# Patient Record
Sex: Male | Born: 1990 | Race: White | Hispanic: No | Marital: Married | Smoking: Former smoker
Health system: Southern US, Community
[De-identification: ages and names within clinical notes are randomized; demographics above are authoritative.]

---

## 2015-09-29 HISTORY — PX: LAPAROSCOPIC INGUINAL HERNIA WITH UMBILICAL HERNIA: SHX5658

## 2020-07-22 ENCOUNTER — Emergency Department (INDEPENDENT_AMBULATORY_CARE_PROVIDER_SITE_OTHER)
Admission: RE | Admit: 2020-07-22 | Discharge: 2020-07-22 | Disposition: A | Discharge status: Home / Self Care | Source: Ambulatory Visit

## 2020-07-22 ENCOUNTER — Other Ambulatory Visit: Payer: Self-pay

## 2020-07-22 ENCOUNTER — Emergency Department (INDEPENDENT_AMBULATORY_CARE_PROVIDER_SITE_OTHER)

## 2020-07-22 ENCOUNTER — Telehealth: Payer: Self-pay | Admitting: Emergency Medicine

## 2020-07-22 VITALS — BP 154/87 | HR 85 | Temp 98.4°F | Resp 17

## 2020-07-22 DIAGNOSIS — M25521 Pain in right elbow: Secondary | ICD-10-CM

## 2020-07-22 DIAGNOSIS — W19XXXA Unspecified fall, initial encounter: Secondary | ICD-10-CM

## 2020-07-22 NOTE — Telephone Encounter (Signed)
Call to see if pt was able to come in sooner. Pt works in [employer] until [employer], lives near [place] and wants to keep appointment for now. 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 It has been evaluated by the [employer] in the past.

## 2020-07-22 NOTE — ED Triage Notes (Signed)
Pt c/o RT elbow pain since last night. 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 Reinjured in 2018. Fell in shower last night reaggravating injury. Pain 6.5/10

## 2020-07-22 NOTE — ED Provider Notes (Signed)
[facility]    CSN: [id number] Arrival date & time: 07/22/20  1842      History   Chief Complaint Chief Complaint  Patient presents with  . Elbow Pain    RT    HPI Timothy Brady is a 29 y.o. male.   HPI Timothy Brady is a 29 y.o. male presenting to UC with c/o exacerbation of chronic Right elbow pain. He injured it about 10 years ago, it has hurt on and off since then.  Last night, he re-injured it after hitting his elbow on a hard surface. Pain is aching and sore, 6/10, worse directly over olecranon process. Worse with certain movement.    History reviewed. No pertinent past medical history.  There are no problems to display for this patient.   History reviewed. No pertinent surgical history.     Home Medications    Prior to Admission medications   Not on File    Family History History reviewed. No pertinent family history.  Social History Social History   Tobacco Use  . Smoking status: Never Smoker  . Smokeless tobacco: Former Neurosurgeon    Types: Engineer, drilling  . Vaping Use: Every day  . Devices: a few months  Substance Use Topics  . Alcohol use: Yes    Comment: occ  . Drug use: Not on file     Allergies   Patient has no known allergies.   Review of Systems Review of Systems  Musculoskeletal: Positive for arthralgias. Negative for joint swelling and myalgias.  Skin: Negative for color change and wound.     Physical Exam Triage Vital Signs ED Triage Vitals  Enc Vitals Group     BP 07/22/20 1935 (!) 154/87     Pulse Rate 07/22/20 1935 85     Resp 07/22/20 1935 17     Temp 07/22/20 1935 98.4 F (36.9 C)     Temp Source 07/22/20 1935 Oral     SpO2 07/22/20 1935 98 %     Weight --      Height --      Head Circumference --      Peak Flow --      Pain Score 07/22/20 1939 6     Pain Loc --      Pain Edu? --      Excl. in GC? --    No data found.  Updated Vital Signs BP (!) 154/87 (BP Location: Right Arm)   Pulse 85    Temp 98.4 F (36.9 C) (Oral)   Resp 17   SpO2 98%   Visual Acuity Right Eye Distance:   Left Eye Distance:   Bilateral Distance:    Right Eye Near:   Left Eye Near:    Bilateral Near:     Physical Exam Vitals and nursing note reviewed.  Constitutional:      Appearance: He is well-developed.  HENT:     Head: Normocephalic and atraumatic.  Cardiovascular:     Rate and Rhythm: Normal rate.  Pulmonary:     Effort: Pulmonary effort is normal.  Musculoskeletal:        General: Tenderness (mild tenderness over olecranon. no crepitus) present. No swelling. Normal range of motion.     Cervical back: Normal range of motion.  Skin:    General: Skin is warm and dry.     Findings: No bruising or erythema.  Neurological:     Mental Status: He is alert and oriented to person,  place, and time.  Psychiatric:        Behavior: Behavior normal.      UC Treatments / Results  Labs (all labs ordered are listed, but only abnormal results are displayed) Labs Reviewed - No data to display  EKG   Radiology No results found.  Procedures Procedures (including critical care time)  Medications Ordered in UC Medications - No data to display  Initial Impression / Assessment and Plan / UC Course  I have reviewed the triage vital signs and the nursing notes.  Pertinent labs & imaging results that were available during my care of the patient were reviewed by me and considered in my medical decision making (see chart for details).     Reviewed imaging with pt Encouraged use of elbow sleeve and sling for comfort F/u with Sports Medicine  AVS given  Final Clinical Impressions(s) / UC Diagnoses   Final diagnoses:  Right elbow pain     Discharge Instructions      You may take 500mg  acetaminophen every 4-6 hours or in combination with ibuprofen 400-600mg  every 6-8 hours as needed for pain and inflammation.  You can use the elbow sleeve and sling for comfort. Call to schedule an  appointment with Sports Medicine later this week for further evaluation and treatment of symptoms.     ED Prescriptions    None     PDMP not reviewed this encounter.   , Lurene Shadow 07/25/20 [phone]

## 2020-07-22 NOTE — Discharge Instructions (Signed)
  You may take 500mg  acetaminophen every 4-6 hours or in combination with ibuprofen 400-600mg  every 6-8 hours as needed for pain and inflammation.  You can use the elbow sleeve and sling for comfort. Call to schedule an appointment with Sports Medicine later this week for further evaluation and treatment of symptoms.

## 2020-07-31 ENCOUNTER — Ambulatory Visit: Payer: Self-pay

## 2020-07-31 ENCOUNTER — Other Ambulatory Visit: Payer: Self-pay | Admitting: Family Medicine

## 2020-07-31 ENCOUNTER — Other Ambulatory Visit: Payer: Self-pay

## 2020-07-31 ENCOUNTER — Ambulatory Visit (INDEPENDENT_AMBULATORY_CARE_PROVIDER_SITE_OTHER): Admitting: Family Medicine

## 2020-07-31 ENCOUNTER — Encounter: Payer: Self-pay | Admitting: Family Medicine

## 2020-07-31 VITALS — BP 137/88 | HR 84 | Ht 67.0 in | Wt 205.0 lb

## 2020-07-31 DIAGNOSIS — M778 Other enthesopathies, not elsewhere classified: Secondary | ICD-10-CM | POA: Diagnosis not present

## 2020-07-31 DIAGNOSIS — M25521 Pain in right elbow: Secondary | ICD-10-CM

## 2020-07-31 MED ORDER — NITROGLYCERIN 0.2 MG/HR TD PT24: MEDICATED_PATCH | TRANSDERMAL | 11 refills | Status: DC

## 2020-07-31 MED FILL — NITROGLYCERIN 0.2 MG/HR PTC: 0.2 | 80 days supply | Qty: 20 | Fill #0

## 2020-07-31 NOTE — Progress Notes (Signed)
Timothy Brady - 29 y.o. male MRN [id number]  Date of birth: [birth year]  SUBJECTIVE:  Including CC & ROS.  Chief Complaint  Patient presents with  . Elbow Pain    right    Timothy Brady is a 29 y.o. male that is presenting with acute on chronic right elbow pain.  He has fallen on this part of his elbow multiple times.  He feels it at the tip of the olecranon.  No history of surgery.  He has pain with or without activity.  Does not workout like he normally does.  Seems localized to the elbow.  Independent review of the right elbow x-ray from 10/25 shows mild dorsal swelling.   Review of Systems See HPI   HISTORY: Past Medical, Surgical, Social, and Family History Reviewed & Updated per EMR.   Pertinent Historical Findings include:  No past medical history on file.  No past surgical history on file.  No family history on file.  Social History   Socioeconomic History  . Marital status: Divorced    Spouse name: Not on file  . Number of children: Not on file  . Years of education: Not on file  . Highest education level: Not on file  Occupational History  . Not on file  Tobacco Use  . Smoking status: Never Smoker  . Smokeless tobacco: Former Neurosurgeon    Types: Engineer, drilling  . Vaping Use: Every day  . Devices: a few months  Substance and Sexual Activity  . Alcohol use: Yes    Comment: occ  . Drug use: Not on file  . Sexual activity: Not on file  Other Topics Concern  . Not on file  Social History Narrative  . Not on file   Social Determinants of Health   Financial Resource Strain:   . Difficulty of Paying Living Expenses: Not on file  Food Insecurity:   . Worried About Programme researcher, broadcasting/film/video in the Last Year: Not on file  . Ran Out of Food in the Last Year: Not on file  Transportation Needs:   . Lack of Transportation (Medical): Not on file  . Lack of Transportation (Non-Medical): Not on file  Physical Activity:   . Days of Exercise per Week: Not on file  . Minutes  of Exercise per Session: Not on file  Stress:   . Feeling of Stress : Not on file  Social Connections:   . Frequency of Communication with Friends and Family: Not on file  . Frequency of Social Gatherings with Friends and Family: Not on file  . Attends Religious Services: Not on file  . Active Member of Clubs or Organizations: Not on file  . Attends Banker Meetings: Not on file  . Marital Status: Not on file  Intimate Partner Violence:   . Fear of Current or Ex-Partner: Not on file  . Emotionally Abused: Not on file  . Physically Abused: Not on file  . Sexually Abused: Not on file     PHYSICAL EXAM:  VS: BP 137/88   Pulse 84   Ht 5\' 7"  (1.702 m)   Wt 205 lb (93 kg)   BMI 32.11 kg/m  Physical Exam Gen: NAD, alert, cooperative with exam, well-appearing MSK:  Right elbow: No ecchymosis Range of motion. Normal strength resistance. Tenderness to palpation over the insertion of the triceps tendon. Neurovascularly intact  Limited ultrasound: Right elbow:  No effusion within the elbow joint. Chronic thickened appearance at the insertion of  the triceps tendon.  There is calcifications within this area from previous injury. No changes of the origin of the lateral epicondyle.  Summary: Findings would suggest a triceps tendinosis  Ultrasound and interpretation by Clare Gandy, MD    ASSESSMENT & PLAN:   Triceps tendinitis There are chronic changes at the insertion of the triceps tendon.  There is calcifications to suggest repetitive and previous injury. -Counseled on home exercise therapy and supportive care. -Nitro patches. -Could consider physical therapy.

## 2020-07-31 NOTE — Assessment & Plan Note (Signed)
There are chronic changes at the insertion of the triceps tendon.  There is calcifications to suggest repetitive and previous injury. -Counseled on home exercise therapy and supportive care. -Nitro patches. -Could consider physical therapy.

## 2020-07-31 NOTE — Patient Instructions (Signed)
Nice to meet you Please try heat  Please try the nitro patches  Please try the exercises   Please send me a message in MyChart with any questions or updates.  Please see me back in 4-6 weeks.   --Dr. Jordan Likes  Nitroglycerin Protocol   Apply 1/4 nitroglycerin patch to affected area daily.  Change position of patch within the affected area every 24 hours.  You may experience a headache during the first 1-2 weeks of using the patch, these should subside.  If you experience headaches after beginning nitroglycerin patch treatment, you may take your preferred over the counter pain reliever.  Another side effect of the nitroglycerin patch is skin irritation or rash related to patch adhesive.  Please notify our office if you develop more severe headaches or rash, and stop the patch.  Tendon healing with nitroglycerin patch may require 12 to 24 weeks depending on the extent of injury.  Men should not use if taking Viagra, Cialis, or Levitra.   Do not use if you have migraines or rosacea.

## 2020-08-01 ENCOUNTER — Telehealth: Payer: Self-pay | Admitting: Family Medicine

## 2020-08-01 NOTE — Telephone Encounter (Signed)
Provided limitations in note.   Myra Rude, MD [facility] 08/01/2020, 10:09 PM

## 2020-08-01 NOTE — Telephone Encounter (Signed)
Patient called states forgot to ask if he will be able to perform PT  ([employer] testing ) tomorrow 11/5 .Marland Kitchenplease advise [phone]  --forwarding message to provider.  --glh

## 2020-08-02 ENCOUNTER — Encounter: Payer: Self-pay | Admitting: Family Medicine

## 2021-06-09 DIAGNOSIS — F4310 Post-traumatic stress disorder, unspecified: Secondary | ICD-10-CM | POA: Insufficient documentation

## 2021-06-09 DIAGNOSIS — F39 Unspecified mood [affective] disorder: Secondary | ICD-10-CM | POA: Insufficient documentation

## 2021-11-28 DIAGNOSIS — F339 Major depressive disorder, recurrent, unspecified: Secondary | ICD-10-CM | POA: Insufficient documentation

## 2021-12-17 IMAGING — DX DG ELBOW COMPLETE 3+V*R*
4 series · 4 of 4 positions shown · non-contrast
Comparison: None.

CLINICAL DATA: Status post fall.

EXAM:
RIGHT ELBOW - COMPLETE 3+ VIEW

[elbow ap]
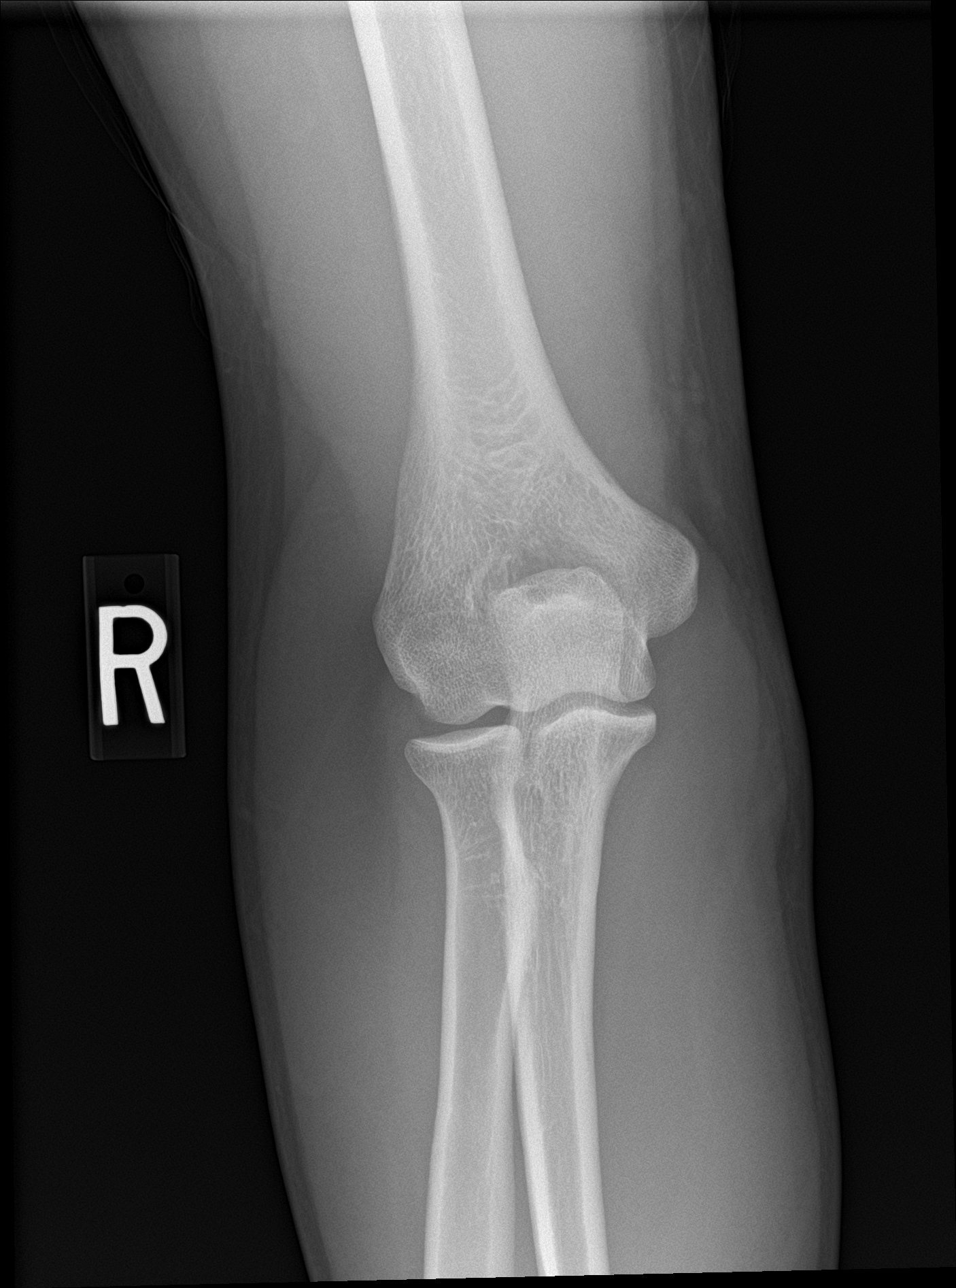

[elbow obl (1 of 2)]
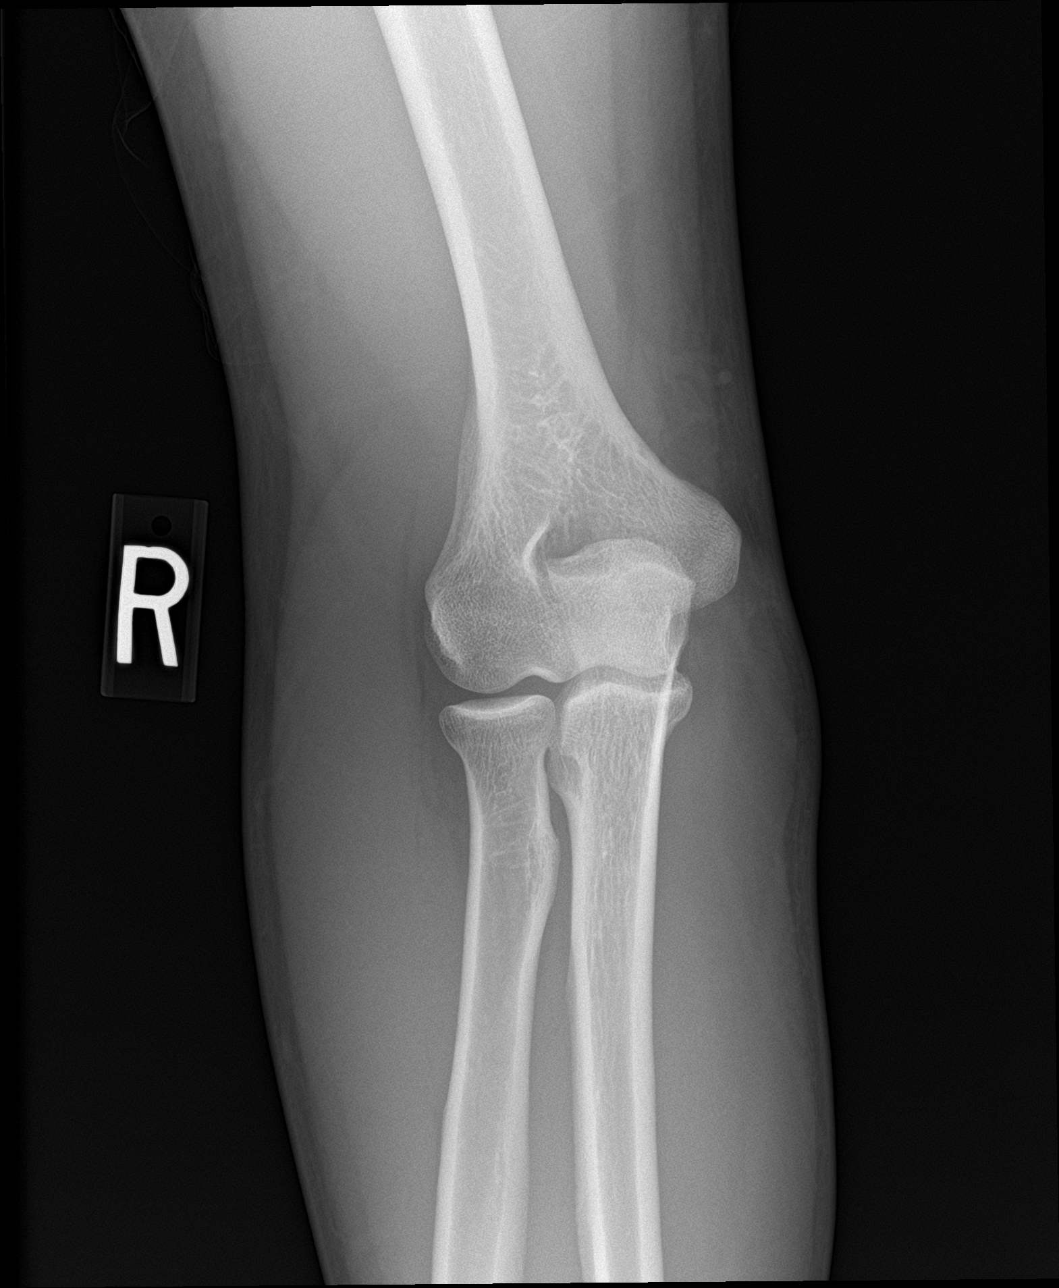

[elbow obl (2 of 2)]
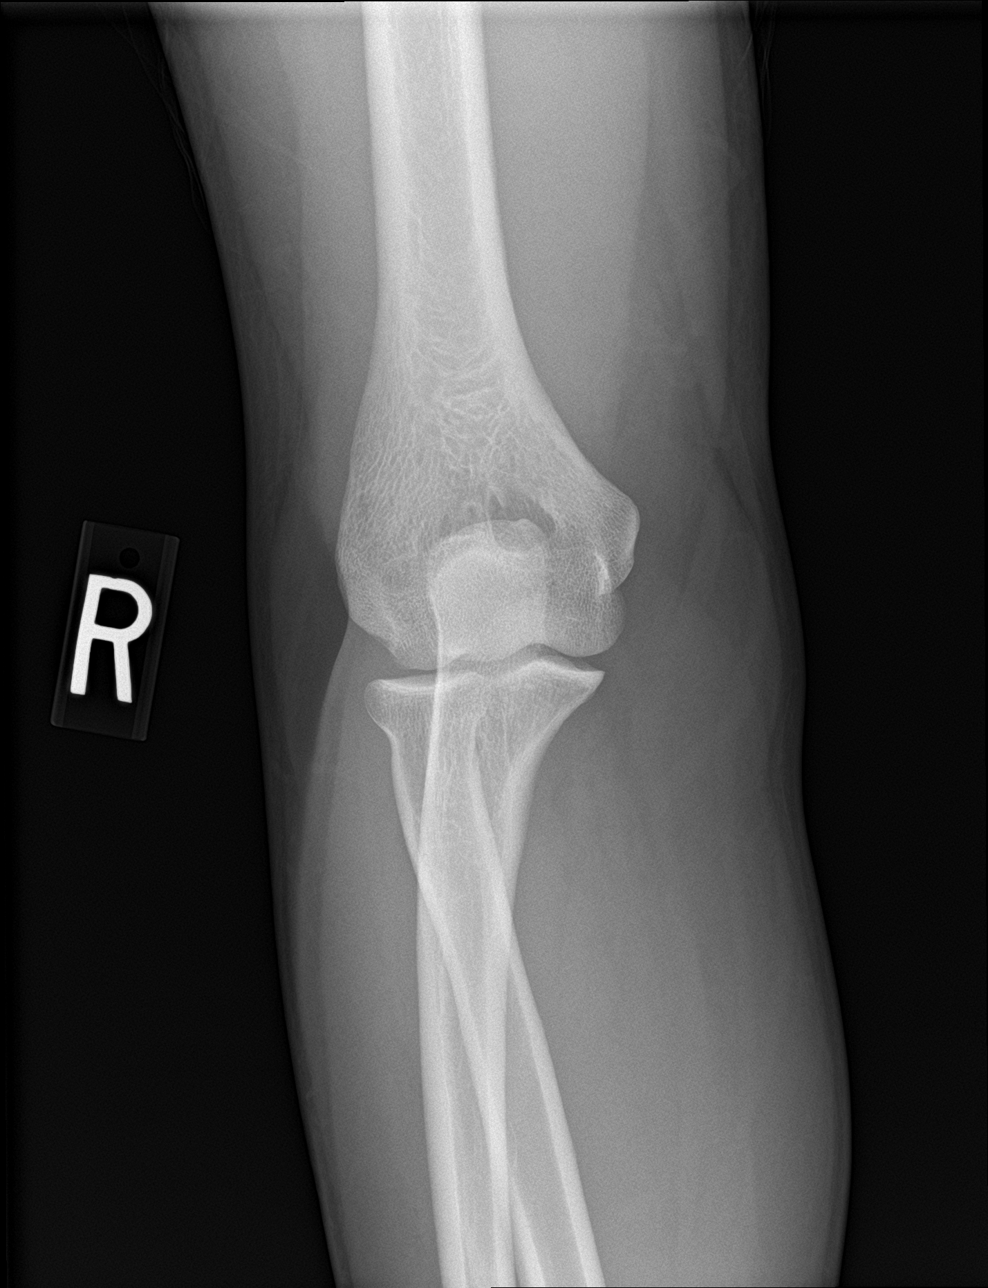

[elbow lat]
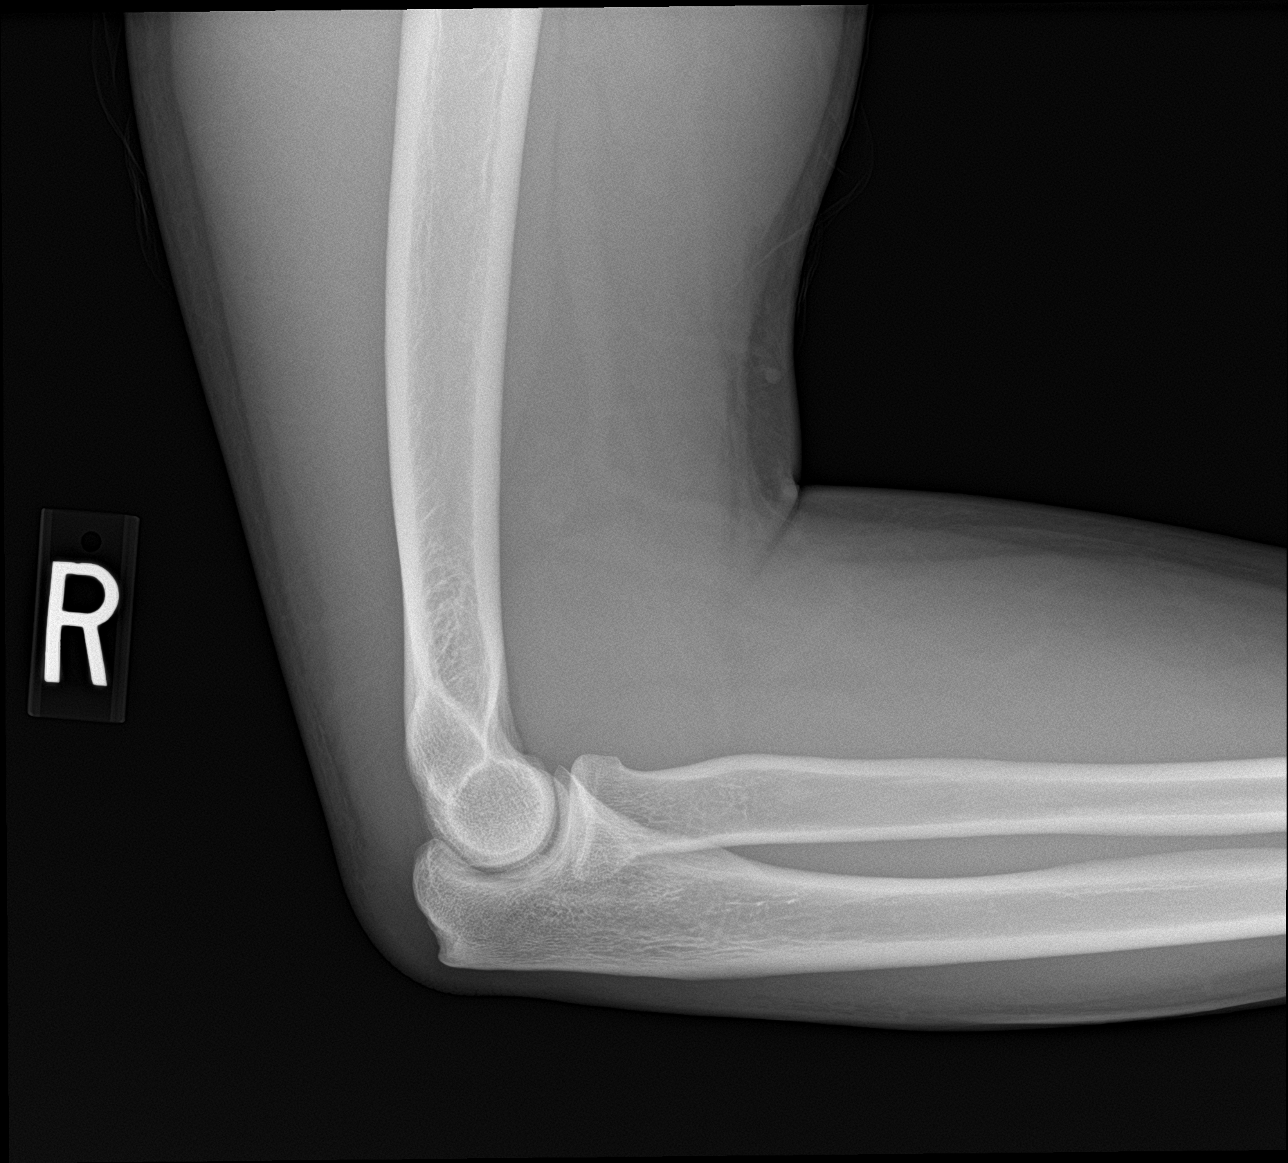

[4 of 4 positions shown; findings below may reference images not displayed]

FINDINGS: There is no evidence of fracture, dislocation, or joint effusion.
There is no evidence of arthropathy or other focal bone abnormality.
There is mild dorsal soft tissue swelling.
IMPRESSION: Mild dorsal soft tissue swelling without acute fracture or
dislocation.

## 2022-01-02 ENCOUNTER — Encounter: Payer: Self-pay | Admitting: Primary Care

## 2022-01-02 ENCOUNTER — Ambulatory Visit (INDEPENDENT_AMBULATORY_CARE_PROVIDER_SITE_OTHER): Admitting: Primary Care

## 2022-01-02 VITALS — BP 132/70 | HR 107 | Temp 98.6°F | Ht 68.0 in | Wt 237.6 lb

## 2022-01-02 DIAGNOSIS — R0683 Snoring: Secondary | ICD-10-CM

## 2022-01-02 DIAGNOSIS — Z9189 Other specified personal risk factors, not elsewhere classified: Secondary | ICD-10-CM | POA: Diagnosis not present

## 2022-01-02 NOTE — Assessment & Plan Note (Signed)
-   Patient has symptoms of loud snoring, gasping for air, restless sleep and daytime fatigue. He also reports frequent sleep walking/talking. Hx sleep paralysis.  Epworth 6. BMI 36. Concern patient could have obstructive sleep apnea, needs polysomnography (in-lab sleep study) to evaluate. Discussed risk of untreated sleep apnea including cardiac arrhythmias, pulm HTN, stroke, DM. We briefly reviewed treatment options including weight loss, oral appliance, CPCP and referral to ENT. Encouraged patient to work on weight loss efforts and focus on side sleeping position/elevate head of bed. Advised against driving if experiencing excessive daytime sleepiness. Follow-up in 4-6 weeks to review sleep study results and discuss treatment options further. ?

## 2022-01-02 NOTE — Progress Notes (Signed)
? ?@Patient  ID: , male    DOB: September 21, 1991, 31 y.o.   MRN: [id number] ? ?Chief Complaint  ?Patient presents with  ? Consult  ?  Here for sleep consult. 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 He states he does snore, morning headaches and dry mouth. Original sleep study was done 10+ years ago.   ? ? ?Referring provider: ?[id number], PA-C ? ?HPI: ?31 year old male, former smoker quit in February 2023.  Past medical history significant for PTDS, mood disorder, sleep disturbance.  ? ?01/02/2022 ?Patient presents today for sleep consult. Referred by PCP. He wakes up at night unable to breath, gasping for air. These symptoms typically last for 30 seconds. He has been told that he snores loudly. He has some associated daytime sleepiness. He does not sleep well at night, on average he gets 4 hours. He has difficulty falling asleep. He can lay in bed for hours before getting to sleep. Patient feels it could be PTSD related. He drinks 2-3 cups of coffee on occasion. He has tried Atarax 50mg  at bedtime, this initially helped some but stopped. He has had 1-2 episodes of sleep paralysis in his life time. He has been known to walk/talk in his sleep. 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 Denies narcolepsy, cataplexy or sleep walking.  ? ? ?Sleep questionnaire ?Symptoms-  loud snoring, wakes up at night gasping for air  ?Prior sleep study- 22 years ago at  ?Bedtime- 12am ?Time to fall asleep- 3-4 hours  ?Nocturnal awakenings- 3-4 times  ?Out of bed/start of day- 5:30am ?Weight changes- yes, 20 lbs ?Do you operate heavy machinery-  Yes ?Do you currently wear CPAP- No ?Do you current wear oxygen- No ?Epworth- 6 ? ? ?Allergies  ?Allergen Reactions  ? Codeine Rash  ? ? ? ?There is no immunization history on file for this patient. ? ?No past medical history on file. ? ?Tobacco History: ?Social History  ? ?Tobacco Use  ?Smoking  Status Former  ? Types: Cigarettes  ? Quit date: 11/24/2021  ? Years since quitting: 0.2  ?Smokeless Tobacco Former  ? Types: Chew  ? Quit date: 11/24/2021  ? ?Counseling given: Not Answered ? ? ?Outpatient Medications Prior to Visit  ?Medication Sig Dispense Refill  ? cyclobenzaprine (FLEXERIL) 10 MG tablet Take by mouth.    ? FLUoxetine HCl 60 MG TABS Take by mouth.    ? nitroGLYCERIN (NITRODUR - DOSED IN MG/24 HR) 0.2 mg/hr patch CUT AND APPLY 1/4 PATCH TO MOST PAINFUL AREA EVERY 24 HOURS 30 patch 11  ? ?No facility-administered medications prior to visit.  ? ? ?Review of Systems ? ?Review of Systems  ?Constitutional:  Positive for fatigue.  ?HENT: Negative.    ?Respiratory:  Positive for apnea.   ?Cardiovascular: Negative.   ?Psychiatric/Behavioral:  Positive for sleep disturbance.   ? ? ?Physical Exam ? ?BP 132/70 (BP Location: Left Arm, Patient Position: Sitting, Cuff Size: Normal)   Pulse (!) 107   Temp 98.6 ?F (37 ?C) (Oral)   Ht 5\' 8"  (1.727 m)   Wt 237 lb 9.6 oz (107.8 kg)   SpO2 100%   BMI 36.13 kg/m?  ?Physical Exam ?Constitutional:   ?   Appearance: Normal appearance.  ?HENT:  ?   Mouth/Throat:  ?   Mouth: Mucous membranes are moist.  ?   Pharynx: Oropharynx is clear.  ?Cardiovascular:  ?  Rate and Rhythm: Normal rate and regular rhythm.  ?Pulmonary:  ?   Effort: Pulmonary effort is normal.  ?   Breath sounds: Normal breath sounds.  ?Musculoskeletal:     ?   General: Normal range of motion.  ?   Cervical back: Normal range of motion and neck supple.  ?Skin: ?   General: Skin is warm and dry.  ?Neurological:  ?   General: No focal deficit present.  ?   Mental Status: He is alert and oriented to person, place, and time. Mental status is at baseline.  ?Psychiatric:     ?   Mood and Affect: Mood normal.     ?   Behavior: Behavior normal.     ?   Thought Content: Thought content normal.     ?   Judgment: Judgment normal.  ?  ? ?Lab Results: ? ?CBC ?No results found for: WBC, RBC, HGB, HCT, PLT, MCV,  MCH, MCHC, RDW, LYMPHSABS, MONOABS, EOSABS, BASOSABS ? ?BMET ?No results found for: NA, K, CL, CO2, GLUCOSE, BUN, CREATININE, CALCIUM, GFRNONAA, GFRAA ? ?BNP ?No results found for: BNP ? ?ProBNP ?No results found for: PROBNP ? ?Imaging: ?No results found. ? ? ?Assessment & Plan:  ? ?Loud snoring ?- Patient has symptoms of loud snoring, gasping for air, restless sleep and daytime fatigue. He also reports frequent sleep walking/talking. Hx sleep paralysis.  Epworth 6. BMI 36. Concern patient could have obstructive sleep apnea, needs polysomnography (in-lab sleep study) to evaluate. Discussed risk of untreated sleep apnea including cardiac arrhythmias, pulm HTN, stroke, DM. We briefly reviewed treatment options including weight loss, oral appliance, CPCP and referral to ENT. Encouraged patient to work on weight loss efforts and focus on side sleeping position/elevate head of bed. Advised against driving if experiencing excessive daytime sleepiness. Follow-up in 4-6 weeks to review sleep study results and discuss treatment options further. ? ? ? ? ?Glenford Bayley, NP ?02/09/2022 ? ?

## 2022-01-02 NOTE — Patient Instructions (Signed)
Sleep apnea is defined as period of 10 seconds or longer when you stop breathing at night. This can happen multiple times a night. Dx sleep apnea is when this occurs more than 5 times an hour.  ?  ?Mild OSA 5-15 apneic events an hour ?Moderate OSA 15-30 apneic events an hour ?Severe OSA > 30 apneic events an hour ?  ?Untreated sleep apnea puts you at higher risk for cardiac arrhythmias, pulmonary HTN, stroke and diabetes ?  ?Treatment options include weight loss, side sleeping position, oral appliance, CPAP therapy or referral to ENT for possible surgical options  ?  ?Recommendations: ?Focus on side sleeping position ?Work on weight loss efforts  ?Do not drive if experiencing excessive daytime sleepiness of fatigue  ?  ?Orders: ?In lab sleep study re: snoring  ?  ?Follow-up: ?Call after you have completed your sleep study to set up a virtual visit to review sleep study results and discuss treatment options further ? ? ?Sleep Apnea ?Sleep apnea affects breathing during sleep. It causes breathing to stop for 10 seconds or more, or to become shallow. People with sleep apnea usually snore loudly. ?It can also increase the risk of: ?Heart attack. ?Stroke. ?Being very overweight (obese). ?Diabetes. ?Heart failure. ?Irregular heartbeat. ?High blood pressure. ?The goal of treatment is to help you breathe normally again. ?What are the causes? ?The most common cause of this condition is a collapsed or blocked airway. ?There are three kinds of sleep apnea: ?Obstructive sleep apnea. This is caused by a blocked or collapsed airway. ?Central sleep apnea. This happens when the brain does not send the right signals to the muscles that control breathing. ?Mixed sleep apnea. This is a combination of obstructive and central sleep apnea. ?What increases the risk? ?Being overweight. ?Smoking. ?Having a small airway. ?Being older. ?Being male. ?Drinking alcohol. ?Taking medicines to calm yourself (sedatives or tranquilizers). ?Having  family members with the condition. ?Having a tongue or tonsils that are larger than normal. ?What are the signs or symptoms? ?Trouble staying asleep. ?Loud snoring. ?Headaches in the morning. ?Waking up gasping. ?Dry mouth or sore throat in the morning. ?Being sleepy or tired during the day. ?If you are sleepy or tired during the day, you may also: ?Not be able to focus your mind (concentrate). ?Forget things. ?Get angry a lot and have mood swings. ?Feel sad (depressed). ?Have changes in your personality. ?Have less interest in sex, if you are male. ?Be unable to have an erection, if you are male. ?How is this treated? ? ?Sleeping on your side. ?Using a medicine to get rid of mucus in your nose (decongestant). ?Avoiding the use of alcohol, medicines to help you relax, or certain pain medicines (narcotics). ?Losing weight, if needed. ?Changing your diet. ?Quitting smoking. ?Using a machine to open your airway while you sleep, such as: ?An oral appliance. This is a mouthpiece that shifts your lower jaw forward. ?A CPAP device. This device blows air through a mask when you breathe out (exhale). ?An EPAP device. This has valves that you put in each nostril. ?A BIPAP device. This device blows air through a mask when you breathe in (inhale) and breathe out. ?Having surgery if other treatments do not work. ?Follow these instructions at home: ?Lifestyle ?Make changes that your doctor recommends. ?Eat a healthy diet. ?Lose weight if needed. ?Avoid alcohol, medicines to help you relax, and some pain medicines. ?Do not smoke or use any products that contain nicotine or tobacco. If you need help  quitting, ask your doctor. ?General instructions ?Take over-the-counter and prescription medicines only as told by your doctor. ?If you were given a machine to use while you sleep, use it only as told by your doctor. ?If you are having surgery, make sure to tell your doctor you have sleep apnea. You may need to bring your device with  you. ?Keep all follow-up visits. ?Contact a doctor if: ?The machine that you were given to use during sleep bothers you or does not seem to be working. ?You do not get better. ?You get worse. ?Get help right away if: ?Your chest hurts. ?You have trouble breathing in enough air. ?You have an uncomfortable feeling in your back, arms, or stomach. ?You have trouble talking. ?One side of your body feels weak. ?A part of your face is hanging down. ?These symptoms may be an emergency. Get help right away. Call your local emergency services (911 in the [place] ?Do not wait to see if the symptoms will go away. ?Do not drive yourself to the hospital. ?Summary ?This condition affects breathing during sleep. ?The most common cause is a collapsed or blocked airway. ?The goal of treatment is to help you breathe normally while you sleep. ?This information is not intended to replace advice given to you by your health care provider. Make sure you discuss any questions you have with your health care provider. ?Document Revised: 04/23/2021 Document Reviewed: 08/23/2020 ?Elsevier Patient Education ? 2022 [employer] ? ?

## 2022-03-06 ENCOUNTER — Ambulatory Visit (HOSPITAL_BASED_OUTPATIENT_CLINIC_OR_DEPARTMENT_OTHER): Payer: Self-pay | Attending: Primary Care | Admitting: Pulmonary Disease

## 2022-03-06 DIAGNOSIS — R0683 Snoring: Secondary | ICD-10-CM | POA: Insufficient documentation

## 2022-03-06 DIAGNOSIS — Z9189 Other specified personal risk factors, not elsewhere classified: Secondary | ICD-10-CM | POA: Insufficient documentation

## 2022-03-10 DIAGNOSIS — R0683 Snoring: Secondary | ICD-10-CM

## 2022-03-10 NOTE — Procedures (Signed)
      Patient Name: Timothy Brady, Timothy Brady Date: 03/06/2022 Gender: Male D.O.B: April 07, 1991 Age (years): 30 Referring Provider: Ames Dura NP Height (inches): 68 Interpreting Physician: Coralyn Helling MD, ABSM Weight (lbs): 215 RPSGT: Cherylann Parr BMI: 33 MRN: [id number] Neck Size: 18.00  CLINICAL INFORMATION Sleep Study Type: NPSG  Indication for sleep study: Snoring, Witnesses Apnea / Gasping During Sleep  Epworth Sleepiness Score: 10  SLEEP STUDY TECHNIQUE As per the AASM Manual for the Scoring of Sleep and Associated Events v2.3 (April 2016) with a hypopnea requiring 4% desaturations.  The channels recorded and monitored were frontal, central and occipital EEG, electrooculogram (EOG), submentalis EMG (chin), nasal and oral airflow, thoracic and abdominal wall motion, anterior tibialis EMG, snore microphone, electrocardiogram, and pulse oximetry.  MEDICATIONS Medications self-administered by patient taken the night of the study : N/A  SLEEP ARCHITECTURE The study was initiated at 10:19:26 PM and ended at 5:15:07 AM.  Sleep onset time was 36.6 minutes and the sleep efficiency was 87.4%%. The total sleep time was 363.1 minutes.  Stage REM latency was 74.0 minutes.  The patient spent 2.3%% of the night in stage N1 sleep, 61.3%% in stage N2 sleep, 9.9%% in stage N3 and 26.4% in REM.  Alpha intrusion was absent.  Supine sleep was 41.58%.  RESPIRATORY PARAMETERS The overall apnea/hypopnea index (AHI) was 10.1 per hour. There were 10 total apneas, including 7 obstructive, 3 central and 0 mixed apneas. There were 51 hypopneas and 0 RERAs.  The AHI during Stage REM sleep was 23.8 per hour.  AHI while supine was 18.3 per hour.  The mean oxygen saturation was 93.7%. The minimum SpO2 during sleep was 78.0%.  Moderate snoring was noted during this study.  CARDIAC DATA The 2 lead EKG demonstrated sinus rhythm. The mean heart rate was 61.6 beats per minute. Other EKG  findings include: None.  LEG MOVEMENT DATA The total PLMS were 0 with a resulting PLMS index of 0.0. Associated arousal with leg movement index was 0.0 .  IMPRESSIONS - Mild obstructive sleep apnea an AHI of 10.1 and SpO2 low of 78%. - The patient snored with moderate snoring volume.  DIAGNOSIS - Obstructive Sleep Apnea (G47.33)  RECOMMENDATIONS - Additional therapies include weight loss, CPAP, oral appliance, or surgical assessment. - Avoid alcohol, sedatives and other CNS depressants that may worsen sleep apnea and disrupt normal sleep architecture. - Sleep hygiene should be reviewed to assess factors that may improve sleep quality. - Weight management and regular exercise should be initiated or continued if appropriate.  [Electronically signed] 03/10/2022 02:26 PM  Coralyn Helling MD, ABSM Diplomate, American Board of Sleep Medicine NPI: [id number]  [facility] PH: [phone]   FX: [phone] ACCREDITED BY THE AMERICAN ACADEMY OF SLEEP MEDICINE

## 2022-03-11 ENCOUNTER — Telehealth: Payer: Self-pay | Admitting: Primary Care

## 2022-03-11 NOTE — Telephone Encounter (Signed)
Please set up either virtual or in office for visit for patient to review sleep study results which showed evidence of mild sleep apnea and treatment options

## 2022-03-11 NOTE — Telephone Encounter (Signed)
Called and spoke with pt letting him know the info per BW and have scheduled a  mychart visit for pt with BW. Nothing further needed.

## 2022-03-19 ENCOUNTER — Encounter: Payer: Self-pay | Admitting: Primary Care

## 2022-03-19 ENCOUNTER — Telehealth (INDEPENDENT_AMBULATORY_CARE_PROVIDER_SITE_OTHER): Payer: Self-pay | Admitting: Primary Care

## 2022-03-19 DIAGNOSIS — G4730 Sleep apnea, unspecified: Secondary | ICD-10-CM

## 2022-03-19 DIAGNOSIS — R0683 Snoring: Secondary | ICD-10-CM

## 2022-03-19 NOTE — Patient Instructions (Addendum)
Sleep study showed that you have mild obstructive sleep apnea, your total apnea hypopnea index score was 10.1 an hour.  You had moderate snoring volume.  Recommendations include weight loss, oral appliance, CPAP therapy or referral to ENT for possible surgical assessment  Do not drive experiencing excessive daytime sleepiness or fatigue.  Focus on side sleeping position or elevate head of bed 30 degrees at night.   Referrals Orthodontics re: mild sleep apnea/snoring - Dr. Toni Arthurs [phone]  Follow-up 6 months with Beth NP for sleep apnea  Sleep Apnea Sleep apnea affects breathing during sleep. It causes breathing to stop for 10 seconds or more, or to become shallow. People with sleep apnea usually snore loudly. It can also increase the risk of: Heart attack. Stroke. Being very overweight (obese). Diabetes. Heart failure. Irregular heartbeat. High blood pressure. The goal of treatment is to help you breathe normally again. What are the causes?  The most common cause of this condition is a collapsed or blocked airway. There are three kinds of sleep apnea: Obstructive sleep apnea. This is caused by a blocked or collapsed airway. Central sleep apnea. This happens when the brain does not send the right signals to the muscles that control breathing. Mixed sleep apnea. This is a combination of obstructive and central sleep apnea. What increases the risk? Being overweight. Smoking. Having a small airway. Being older. Being male. Drinking alcohol. Taking medicines to calm yourself (sedatives or tranquilizers). Having family members with the condition. Having a tongue or tonsils that are larger than normal. What are the signs or symptoms? Trouble staying asleep. Loud snoring. Headaches in the morning. Waking up gasping. Dry mouth or sore throat in the morning. Being sleepy or tired during the day. If you are sleepy or tired during the day, you may also: Not be able to focus  your mind (concentrate). Forget things. Get angry a lot and have mood swings. Feel sad (depressed). Have changes in your personality. Have less interest in sex, if you are male. Be unable to have an erection, if you are male. How is this treated?  Sleeping on your side. Using a medicine to get rid of mucus in your nose (decongestant). Avoiding the use of alcohol, medicines to help you relax, or certain pain medicines (narcotics). Losing weight, if needed. Changing your diet. Quitting smoking. Using a machine to open your airway while you sleep, such as: An oral appliance. This is a mouthpiece that shifts your lower jaw forward. A CPAP device. This device blows air through a mask when you breathe out (exhale). An EPAP device. This has valves that you put in each nostril. A BIPAP device. This device blows air through a mask when you breathe in (inhale) and breathe out. Having surgery if other treatments do not work. Follow these instructions at home: Lifestyle Make changes that your doctor recommends. Eat a healthy diet. Lose weight if needed. Avoid alcohol, medicines to help you relax, and some pain medicines. Do not smoke or use any products that contain nicotine or tobacco. If you need help quitting, ask your doctor. General instructions Take over-the-counter and prescription medicines only as told by your doctor. If you were given a machine to use while you sleep, use it only as told by your doctor. If you are having surgery, make sure to tell your doctor you have sleep apnea. You may need to bring your device with you. Keep all follow-up visits. Contact a doctor if: The machine that you were  given to use during sleep bothers you or does not seem to be working. You do not get better. You get worse. Get help right away if: Your chest hurts. You have trouble breathing in enough air. You have an uncomfortable feeling in your back, arms, or stomach. You have trouble  talking. One side of your body feels weak. A part of your face is hanging down. These symptoms may be an emergency. Get help right away. Call your local emergency services (911 in the [place] Do not wait to see if the symptoms will go away. Do not drive yourself to the hospital. Summary This condition affects breathing during sleep. The most common cause is a collapsed or blocked airway. The goal of treatment is to help you breathe normally while you sleep. This information is not intended to replace advice given to you by your health care provider. Make sure you discuss any questions you have with your health care provider. Document Revised: 04/23/2021 Document Reviewed: 08/23/2020 Elsevier Patient Education  2023 [employer].

## 2022-03-19 NOTE — Progress Notes (Signed)
Virtual Visit via Video Note  I connected with Timothy Brady on 03/19/22 at 11:30 AM EDT by a video enabled telemedicine application and verified that I am speaking with the correct person using two identifiers.  Location: Patient: Home Provider: Office   I discussed the limitations of evaluation and management by telemedicine and the availability of in person appointments. The patient expressed understanding and agreed to proceed.  History of Present Illness:  31 year old male, former smoker quit in February 2023.  Past medical history significant for PTDS, mood disorder, sleep disturbance.   Previous [facility] pulmonary encounter: 01/02/2022 Patient presents today for sleep consult. Referred by PCP. He wakes up at night unable to breath, gasping for air. These symptoms typically last for 30 seconds. He has been told that he snores loudly. He has some associated daytime sleepiness. He does not sleep well at night, on average he gets 4 hours. He has difficulty falling asleep. He can lay in bed for hours before getting to sleep. Patient feels it could be PTSD related. He drinks 2-3 cups of coffee on occasion. He has tried Atarax 50mg  at bedtime, this initially helped some but stopped. He has had 1-2 episodes of sleep paralysis in his life time. He has been known to walk/talk in his sleep. He works in the . He is occasionally heavy machine operaration. Denies narcolepsy, cataplexy or sleep walking.   Sleep questionnaire Symptoms-  loud snoring, wakes up at night gasping for air  Prior sleep study- 22 years ago [facility] [employer]  Bedtime- 12am Time to fall asleep- 3-4 hours  Nocturnal awakenings- 3-4 times  Out of bed/start of day- 5:30am Weight changes- yes, 20 lbs Do you operate heavy machinery-  Yes Do you currently wear CPAP- No Do you current wear oxygen- No Epworth- 6  03/19/2022- Interim hx  Patient presents today for to review sleep study results.  Patient has symptoms of  loud snoring and episodes of waking up gasping for air.  PSG on 03/06/22 showed mild obstruction sleep apnea, AHI 10.1 with SpO2 low 78% (average 93%). Moderate snoring volume.  Reviewed sleep study results.  Discussed treatment options including weight loss, CPAP, oral appliance or surgical assessment.  Patient would like to start off with oral appliance first, if not effective can try CPAP.   Observations/Objective:  - Appear well; no overt respiratory symptoms  Assessment and Plan:  Sleep apnea - Patient has symptoms of snoring and episodes of waking up gasping for air. - PSG on 03/06/2022 showed mild obstructive sleep apnea, AHI 10.1/hr - Reviewed treatment options including weight loss, oral appliance, CPAP therapy or referral to ENT for surgical assessment. Patient would like to trial oral appliance first, if not effective can try CPAP at that time - Referred to dentistry/orthodontics, Dr. 05/06/2022 [phone] - Advised against driving experiencing excessive daytime sleepiness fatigue.  Encourage patient to focus on side sleeping position or elevate head of bed 30 degrees at night  Follow Up Instructions:   - 6 months with [facility] NP or sooner if needed  I discussed the assessment and treatment plan with the patient. The patient was provided an opportunity to ask questions and all were answered. The patient agreed with the plan and demonstrated an understanding of the instructions.   The patient was advised to call back or seek an in-person evaluation if the symptoms worsen or if the condition fails to improve as anticipated.  I provided 22 minutes of non-face-to-face time during this  encounter.   Glenford Bayley, NP

## 2022-12-04 ENCOUNTER — Encounter: Payer: Self-pay | Admitting: Family Medicine

## 2022-12-04 ENCOUNTER — Ambulatory Visit (INDEPENDENT_AMBULATORY_CARE_PROVIDER_SITE_OTHER): Admitting: Family Medicine

## 2022-12-04 VITALS — BP 113/73 | HR 80 | Temp 98.1°F | Resp 16 | Ht 67.0 in | Wt 221.8 lb

## 2022-12-04 DIAGNOSIS — Z0289 Encounter for other administrative examinations: Secondary | ICD-10-CM | POA: Insufficient documentation

## 2022-12-04 DIAGNOSIS — E6609 Other obesity due to excess calories: Secondary | ICD-10-CM

## 2022-12-04 DIAGNOSIS — Z7689 Persons encountering health services in other specified circumstances: Secondary | ICD-10-CM | POA: Diagnosis not present

## 2022-12-04 DIAGNOSIS — Z6834 Body mass index (BMI) 34.0-34.9, adult: Secondary | ICD-10-CM | POA: Diagnosis not present

## 2022-12-04 DIAGNOSIS — H6690 Otitis media, unspecified, unspecified ear: Secondary | ICD-10-CM | POA: Insufficient documentation

## 2022-12-04 NOTE — Progress Notes (Unsigned)
Patient is here to established care with provider today. Patient has many health concern they would like to discuss with provider today  Care gaps discuss at appointment today  

## 2022-12-04 NOTE — Progress Notes (Unsigned)
   New Patient Office Visit  Subjective    Patient ID: Timothy Brady, male    DOB: November 27, 1990  Age: 32 y.o. MRN: [id number]  CC: No chief complaint on file.   HPI Motty Borin presents to establish care and for concern regarding his BMI. He is in the [employer] and he needs to be at 24 BMI or less within the month for special training.    Outpatient Encounter Medications as of 12/04/2022  Medication Sig   [DISCONTINUED] cyclobenzaprine (FLEXERIL) 10 MG tablet Take by mouth. (Patient not taking: Reported on 03/19/2022)   [DISCONTINUED] FLUoxetine HCl 60 MG TABS Take by mouth. (Patient not taking: Reported on 03/19/2022)   No facility-administered encounter medications on file as of 12/04/2022.    No past medical history on file.  No past surgical history on file.  No family history on file.  Social History   Socioeconomic History   Marital status: Divorced    Spouse name: Not on file   Number of children: Not on file   Years of education: Not on file   Highest education level: Not on file  Occupational History   Not on file  Tobacco Use   Smoking status: Former    Types: Cigarettes    Quit date: 11/24/2021    Years since quitting: 1.0   Smokeless tobacco: Former    Types: Chew    Quit date: 11/24/2021  Vaping Use   Vaping Use: Every day   Devices: a few months  Substance and Sexual Activity   Alcohol use: Yes    Comment: occ   Drug use: Not on file   Sexual activity: Not on file  Other Topics Concern   Not on file  Social History Narrative   Not on file   Social Determinants of Health   Financial Resource Strain: Not on file  Food Insecurity: Not on file  Transportation Needs: Not on file  Physical Activity: Not on file  Stress: Not on file  Social Connections: Not on file  Intimate Partner Violence: Not on file    Review of Systems  All other systems reviewed and are negative.       Objective    BP 113/73   Pulse 80   Temp 98.1 F (36.7 C) (Oral)    Resp 16   Ht 5\' 7"  (1.702 m)   Wt 221 lb 12.8 oz (100.6 kg)   SpO2 97%   BMI 34.74 kg/m   Physical Exam Vitals and nursing note reviewed.  Constitutional:      General: He is not in acute distress. Cardiovascular:     Rate and Rhythm: Normal rate and regular rhythm.  Pulmonary:     Effort: Pulmonary effort is normal.     Breath sounds: Normal breath sounds.  Neurological:     General: No focal deficit present.     Mental Status: He is alert and oriented to person, place, and time.     {Labs (Optional):23779}    Assessment & Plan:   Problem List Items Addressed This Visit   None   No follow-ups on file.   Becky Sax, MD

## 2022-12-07 ENCOUNTER — Encounter: Payer: Self-pay | Admitting: Family Medicine

## 2023-01-11 ENCOUNTER — Encounter: Payer: Self-pay | Admitting: *Deleted

## 2023-01-28 ENCOUNTER — Encounter: Payer: Self-pay | Admitting: Family Medicine

## 2023-08-01 ENCOUNTER — Ambulatory Visit
Admission: EM | Admit: 2023-08-01 | Discharge: 2023-08-01 | Disposition: A | Discharge status: Home / Self Care | Attending: Internal Medicine | Admitting: Internal Medicine

## 2023-08-01 ENCOUNTER — Ambulatory Visit

## 2023-08-01 DIAGNOSIS — J069 Acute upper respiratory infection, unspecified: Secondary | ICD-10-CM | POA: Diagnosis not present

## 2023-08-01 DIAGNOSIS — J189 Pneumonia, unspecified organism: Secondary | ICD-10-CM

## 2023-08-01 DIAGNOSIS — R519 Headache, unspecified: Secondary | ICD-10-CM | POA: Diagnosis not present

## 2023-08-01 DIAGNOSIS — R059 Cough, unspecified: Secondary | ICD-10-CM | POA: Diagnosis not present

## 2023-08-01 DIAGNOSIS — R053 Chronic cough: Secondary | ICD-10-CM | POA: Diagnosis not present

## 2023-08-01 MED ORDER — ALBUTEROL SULFATE HFA 108 (90 BASE) MCG/ACT IN AERS: 1.0000 | INHALATION_SPRAY | Freq: Four times a day (QID) | RESPIRATORY_TRACT | 0 refills | Status: AC | PRN

## 2023-08-01 MED ORDER — AZITHROMYCIN 250 MG PO TABS: ORAL_TABLET | ORAL | 0 refills | Status: AC

## 2023-08-01 MED ORDER — KETOROLAC TROMETHAMINE 30 MG/ML IJ SOLN
30.0000 mg | Freq: Once | INTRAMUSCULAR | Status: AC
  Administered 2023-08-01: 30 mg via INTRAMUSCULAR

## 2023-08-01 MED ORDER — BENZONATATE 100 MG PO CAPS: 100.0000 mg | ORAL_CAPSULE | Freq: Three times a day (TID) | ORAL | 0 refills | Status: AC | PRN

## 2023-08-01 MED ORDER — PREDNISONE 20 MG PO TABS: 40.0000 mg | ORAL_TABLET | Freq: Every day | ORAL | 0 refills | Status: AC

## 2023-08-01 MED ORDER — METHYLPREDNISOLONE ACETATE 80 MG/ML IJ SUSP
80.0000 mg | Freq: Once | INTRAMUSCULAR | Status: AC
  Administered 2023-08-01: 80 mg via INTRAMUSCULAR

## 2023-08-01 MED ORDER — AMOXICILLIN-POT CLAVULANATE 875-125 MG PO TABS: 1.0000 | ORAL_TABLET | Freq: Two times a day (BID) | ORAL | 0 refills | Status: AC

## 2023-08-01 NOTE — ED Provider Notes (Signed)
[facility]    CSN: [id number] Arrival date & time: 08/01/23  2952      History   Chief Complaint No chief complaint on file.   HPI Timothy Brady is a 32 y.o. male.   Patient presents with approximately 6 to 7-day history of fever, cough, headache.  Patient was seen by different provider on 10/31 where he had negative COVID and flu test.  He was advised that symptoms are viral and to take over-the-counter medications.  On that same day, he had a syncopal episode where he was taken to the emergency department.  He had CT imaging of the head which was negative for any acute abnormality.  He also had a chest x-ray completed at that time.  Patient had full workup with blood work as well that was unremarkable.  He was sent home with symptom management.  He reports that headache has been present since symptoms started and is severe.  He is not sure if he hit his head when he fell but states that headache was present prior to fall.  Headache is present throughout the head.  He denies history of migraines.  Has been alternating Tylenol and ibuprofen for headache with last dose of ibuprofen today at approximately 5 AM with minimal improvement.  Denies dizziness, blurred vision, nausea, vomiting.  Reports he does not really have any additional nasal congestion.  He does have a productive cough but denies shortness of breath or chest pain.  Denies history of asthma.     History reviewed. No pertinent past medical history.  Patient Active Problem List   Diagnosis Date Noted   Health examination of defined subpopulation 12/04/2022   Otitis media 12/04/2022   Loud snoring 01/02/2022   Episode of recurrent major depressive disorder (HCC) 11/28/2021   Mood disorder (HCC) 06/09/2021   PTSD (post-traumatic stress disorder) 06/09/2021   Triceps tendinitis 07/31/2020    Past Surgical History:  Procedure Laterality Date   LAPAROSCOPIC INGUINAL HERNIA WITH UMBILICAL HERNIA  2017        Home Medications    Prior to Admission medications   Medication Sig Start Date End Date Taking? Authorizing Provider  albuterol (VENTOLIN HFA) 108 (90 Base) MCG/ACT inhaler Inhale 1-2 puffs into the lungs every 6 (six) hours as needed for wheezing or shortness of breath. 08/01/23  Yes Pierra Skora, Rolly Salter E, FNP  amoxicillin-clavulanate (AUGMENTIN) 875-125 MG tablet Take 1 tablet by mouth every 12 (twelve) hours. 08/01/23  Yes Donney Caraveo, Rolly Salter E, FNP  azithromycin (ZITHROMAX Z-PAK) 250 MG tablet Take 2 tablets (500 mg total) by mouth daily for 1 day, THEN 1 tablet (250 mg total) daily for 4 days. 08/01/23 08/06/23 Yes Eamonn Sermeno, Acie Fredrickson, FNP  benzonatate (TESSALON) 100 MG capsule Take 1 capsule (100 mg total) by mouth every 8 (eight) hours as needed for cough. 08/01/23  Yes Yakira Duquette, Rolly Salter E, FNP  predniSONE (DELTASONE) 20 MG tablet Take 2 tablets (40 mg total) by mouth daily for 5 days. 08/01/23 08/06/23 Yes Gustavus Bryant, FNP    Family History No family history on file.  Social History Social History   Tobacco Use   Smoking status: Former    Current packs/day: 0.00    Types: Cigarettes    Quit date: 11/24/2021    Years since quitting: 1.6   Smokeless tobacco: Former    Types: Chew    Quit date: 11/24/2021  Vaping Use   Vaping status: Every Day   Devices: a few months  Substance Use  Topics   Alcohol use: Yes    Comment: occ     Allergies   Codeine   Review of Systems Review of Systems Per HPI  Physical Exam Triage Vital Signs ED Triage Vitals  Encounter Vitals Group     BP 08/01/23 0930 127/74     Systolic BP Percentile --      Diastolic BP Percentile --      Pulse Rate 08/01/23 0930 77     Resp --      Temp 08/01/23 0930 98.6 F (37 C)     Temp Source 08/01/23 0930 Oral     SpO2 08/01/23 0930 100 %     Weight 08/01/23 0927 192 lb (87.1 kg)     Height 08/01/23 0927 5\' 8"  (1.727 m)     Head Circumference --      Peak Flow --      Pain Score 08/01/23 0927 8     Pain Loc  --      Pain Education --      Exclude from Growth Chart --    No data found.  Updated Vital Signs BP 127/74 (BP Location: Left Arm)   Pulse 77   Temp 98.6 F (37 C) (Oral)   Ht 5\' 8"  (1.727 m)   Wt 192 lb (87.1 kg)   SpO2 100%   BMI 29.19 kg/m   Visual Acuity Right Eye Distance:   Left Eye Distance:   Bilateral Distance:    Right Eye Near:   Left Eye Near:    Bilateral Near:     Physical Exam Constitutional:      General: He is not in acute distress.    Appearance: Normal appearance. He is not toxic-appearing or diaphoretic.  HENT:     Head: Normocephalic and atraumatic.     Right Ear: Tympanic membrane and ear canal normal.     Left Ear: Tympanic membrane and ear canal normal.     Nose: Congestion present.     Mouth/Throat:     Mouth: Mucous membranes are moist.     Pharynx: No posterior oropharyngeal erythema.  Eyes:     Extraocular Movements: Extraocular movements intact.     Conjunctiva/sclera: Conjunctivae normal.     Pupils: Pupils are equal, round, and reactive to light.  Cardiovascular:     Rate and Rhythm: Normal rate and regular rhythm.     Pulses: Normal pulses.     Heart sounds: Normal heart sounds.  Pulmonary:     Effort: Pulmonary effort is normal. No respiratory distress.     Breath sounds: Normal breath sounds. No stridor. No wheezing, rhonchi or rales.  Abdominal:     General: Abdomen is flat. Bowel sounds are normal.     Palpations: Abdomen is soft.  Musculoskeletal:        General: Normal range of motion.     Cervical back: Normal range of motion.  Skin:    General: Skin is warm and dry.  Neurological:     General: No focal deficit present.     Mental Status: He is alert and oriented to person, place, and time. Mental status is at baseline.     Cranial Nerves: Cranial nerves 2-12 are intact.     Sensory: Sensation is intact.     Motor: Motor function is intact.     Coordination: Coordination is intact.     Gait: Gait is intact.   Psychiatric:        Mood and  Affect: Mood normal.        Behavior: Behavior normal.      UC Treatments / Results  Labs (all labs ordered are listed, but only abnormal results are displayed) Labs Reviewed - No data to display  EKG   Radiology DG Chest 2 View  Result Date: 08/01/2023 CLINICAL DATA:  Cough and fever for 1 week. EXAM: CHEST - 2 VIEW COMPARISON:  07/29/2023 FINDINGS: The cardiac silhouette, mediastinal and contours are within limits and stable. Diffuse bronchitic type interstitial changes with peribronchial thickening and increased interstitial markings. This is most pronounced in the left lower lobe with there is a nodular interstitial pattern suggesting atypical pneumonia. No pleural effusions or pulmonary lesions. The bony thorax is intact. IMPRESSION: Diffuse bronchitic type interstitial changes with a more nodular interstitial pattern in the left lower lobe suggesting atypical/interstitial/viral pneumonia. Electronically Signed   By: Rudie Meyer M.D.   On: 08/01/2023 10:51    Procedures Procedures (including critical care time)  Medications Ordered in UC Medications  ketorolac (TORADOL) 30 MG/ML injection 30 mg (30 mg Intramuscular Given 08/01/23 1038)  methylPREDNISolone acetate (DEPO-MEDROL) injection 80 mg (80 mg Intramuscular Given 08/01/23 1038)    Initial Impression / Assessment and Plan / UC Course  I have reviewed the triage vital signs and the nursing notes.  Pertinent labs & imaging results that were available during my care of the patient were reviewed by me and considered in my medical decision making (see chart for details).     Chest x-ray showing concerns for bronchitis versus atypical pneumonia.  Azithromycin was sent to help treat this.  I will prescribe Augmentin as well given previous x-ray imaging and persistence of patient's symptoms.  Albuterol inhaler prescribed to take as needed for excessive coughing, any shortness of breath, wheezing.   Benzonatate prescribed to take as needed for cough.  Patient was also given IM Toradol and steroid today in urgent care help alleviate headache and respiratory symptoms.  Advised no NSAIDs for at least 24 hours following injection. Prednisone prescribed for patient to start taking tomorrow as well.  Neuroexam is normal and patient had previous CT imaging of the head after his fall, so do not think that additional evaluation at the emergency department is necessary at this time.  Although, patient was given strict ER precautions if any symptoms persist or worsen especially his headache after medications were administered today.  Advised patient to follow-up with PCP or urgent care in 4 to 6 weeks to have repeat imaging of the chest to ensure infection is clear.  Patient verbalized understanding and was agreeable with plan. Final Clinical Impressions(s) / UC Diagnoses   Final diagnoses:  Persistent cough  Acute nonintractable headache, unspecified headache type  Atypical pneumonia     Discharge Instructions      I will call you if x-ray result is abnormal.  I have prescribed you an antibiotic, inhaler, cough medication.  You were also prescribed prednisone steroid which you will start taking tomorrow given you received a steroid shot today in urgent care.  You were also given a Toradol shot so please do not take any ibuprofen, Advil, Aleve for least 24 hours following injection.  If headache does not improve or if it worsens in the next 24 to 48 hours, please go to the emergency department.    ED Prescriptions     Medication Sig Dispense Auth. Provider   amoxicillin-clavulanate (AUGMENTIN) 875-125 MG tablet Take 1 tablet by mouth every 12 (twelve)  hours. 14 tablet [place], [place] E, [place]   predniSONE (DELTASONE) 20 MG tablet Take 2 tablets (40 mg total) by mouth daily for 5 days. 10 tablet [place], [place] E, [place]   benzonatate (TESSALON) 100 MG capsule Take 1 capsule (100 mg total) by mouth every 8  (eight) hours as needed for cough. 21 capsule [place], [place] E, [place]   albuterol (VENTOLIN HFA) 108 (90 Base) MCG/ACT inhaler Inhale 1-2 puffs into the lungs every 6 (six) hours as needed for wheezing or shortness of breath. 1 each Robert Lee, Rolly Salter E, FNP   azithromycin (ZITHROMAX Z-PAK) 250 MG tablet Take 2 tablets (500 mg total) by mouth daily for 1 day, THEN 1 tablet (250 mg total) daily for 4 days. 6 tablet [place], Acie Fredrickson, [place]      PDMP not reviewed this encounter.   Gustavus Bryant, [place] 08/01/23 [phone]

## 2023-08-01 NOTE — ED Triage Notes (Signed)
Patient reports a fever x 1 week, cough, headache. Treated with Tylenol and Motrin. States he have a trip planned and want to make sure he is well.

## 2023-08-01 NOTE — Discharge Instructions (Signed)
I will call you if x-ray result is abnormal.  I have prescribed you an antibiotic, inhaler, cough medication.  You were also prescribed prednisone steroid which you will start taking tomorrow given you received a steroid shot today in urgent care.  You were also given a Toradol shot so please do not take any ibuprofen, Advil, Aleve for least 24 hours following injection.  If headache does not improve or if it worsens in the next 24 to 48 hours, please go to the emergency department.

## 2024-01-10 ENCOUNTER — Emergency Department (HOSPITAL_COMMUNITY)

## 2024-01-10 ENCOUNTER — Encounter (HOSPITAL_COMMUNITY): Payer: Self-pay

## 2024-01-10 ENCOUNTER — Other Ambulatory Visit: Payer: Self-pay

## 2024-01-10 ENCOUNTER — Emergency Department (HOSPITAL_COMMUNITY)
Admission: EM | Admit: 2024-01-10 | Discharge: 2024-01-10 | Discharge status: Against Medical Advice | Attending: Emergency Medicine | Admitting: Emergency Medicine

## 2024-01-10 DIAGNOSIS — M7989 Other specified soft tissue disorders: Secondary | ICD-10-CM | POA: Insufficient documentation

## 2024-01-10 DIAGNOSIS — W208XXA Other cause of strike by thrown, projected or falling object, initial encounter: Secondary | ICD-10-CM | POA: Diagnosis not present

## 2024-01-10 DIAGNOSIS — M79661 Pain in right lower leg: Secondary | ICD-10-CM | POA: Diagnosis present

## 2024-01-10 DIAGNOSIS — Z5321 Procedure and treatment not carried out due to patient leaving prior to being seen by health care provider: Secondary | ICD-10-CM | POA: Insufficient documentation
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
## 2024-01-10 NOTE — ED Notes (Signed)
 Pt stated they needed to be in another state for work tonight and could not wait.

## 2024-01-11 ENCOUNTER — Emergency Department (HOSPITAL_BASED_OUTPATIENT_CLINIC_OR_DEPARTMENT_OTHER)

## 2024-01-11 ENCOUNTER — Encounter (HOSPITAL_BASED_OUTPATIENT_CLINIC_OR_DEPARTMENT_OTHER): Payer: Self-pay | Admitting: Pharmacy Technician

## 2024-01-11 ENCOUNTER — Emergency Department (HOSPITAL_BASED_OUTPATIENT_CLINIC_OR_DEPARTMENT_OTHER)
Admission: EM | Admit: 2024-01-11 | Discharge: 2024-01-11 | Disposition: A | Discharge status: Home / Self Care | Attending: Emergency Medicine | Admitting: Emergency Medicine

## 2024-01-11 ENCOUNTER — Other Ambulatory Visit: Payer: Self-pay

## 2024-01-11 DIAGNOSIS — M79661 Pain in right lower leg: Secondary | ICD-10-CM | POA: Insufficient documentation

## 2024-01-11 DIAGNOSIS — R600 Localized edema: Secondary | ICD-10-CM

## 2024-01-11 MED ORDER — IBUPROFEN 600 MG PO TABS: 600.0000 mg | ORAL_TABLET | Freq: Four times a day (QID) | ORAL | 0 refills | Status: AC | PRN

## 2024-01-11 MED ORDER — IBUPROFEN 400 MG PO TABS
600.0000 mg | ORAL_TABLET | Freq: Once | ORAL | Status: AC
  Administered 2024-01-11: 600 mg via ORAL
  Filled 2024-01-11: qty 1
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
## 2024-01-11 NOTE — Discharge Instructions (Signed)
 As discussed, ultrasound did not show evidence of blood clot.  Suspect muscular injury causing the swelling.  Continue to wear compressive stockings up to your knee or Ace bandage wrapped up to the level of your knee to help push the swelling back towards your heart.  Also recommend elevating your leg above the level of your heart to help with the swelling as well.  You may take ibuprofen for treatment of pain/inflammation.
